# Patient Record
Sex: Male | Born: 1990 | Race: Black or African American | Hispanic: No | Smoking: Current some day smoker
Health system: Southern US, Community
[De-identification: ages and names within clinical notes are randomized; demographics above are authoritative.]

---

## 2019-06-11 ENCOUNTER — Emergency Department (HOSPITAL_COMMUNITY): Payer: Self-pay

## 2019-06-11 ENCOUNTER — Emergency Department (HOSPITAL_COMMUNITY)
Admission: EM | Admit: 2019-06-11 | Discharge: 2019-06-11 | Disposition: A | Discharge status: Home / Self Care | Payer: Self-pay | Attending: Emergency Medicine | Admitting: Emergency Medicine

## 2019-06-11 ENCOUNTER — Other Ambulatory Visit: Payer: Self-pay

## 2019-06-11 ENCOUNTER — Encounter (HOSPITAL_COMMUNITY): Payer: Self-pay

## 2019-06-11 DIAGNOSIS — F17200 Nicotine dependence, unspecified, uncomplicated: Secondary | ICD-10-CM | POA: Insufficient documentation

## 2019-06-11 DIAGNOSIS — R112 Nausea with vomiting, unspecified: Secondary | ICD-10-CM | POA: Insufficient documentation

## 2019-06-11 DIAGNOSIS — R1013 Epigastric pain: Secondary | ICD-10-CM | POA: Insufficient documentation

## 2019-06-11 LAB — COMPREHENSIVE METABOLIC PANEL
ALT: 10 U/L (ref 0–44)
AST: 16 U/L (ref 15–41)
Albumin: 4.4 g/dL (ref 3.5–5.0)
Alkaline Phosphatase: 54 U/L (ref 38–126)
Anion gap: 10 (ref 5–15)
BUN: 7 mg/dL (ref 6–20)
CO2: 25 mmol/L (ref 22–32)
Calcium: 9.4 mg/dL (ref 8.9–10.3)
Chloride: 105 mmol/L (ref 98–111)
Creatinine, Ser: 1.19 mg/dL (ref 0.61–1.24)
GFR calc Af Amer: >60 mL/min (ref >60)
GFR calc non Af Amer: >60 mL/min (ref >60)
Glucose, Bld: 99 mg/dL (ref 70–99)
Potassium: 3.7 mmol/L (ref 3.5–5.1)
Sodium: 140 mmol/L (ref 135–145)
Total Bilirubin: 0.5 mg/dL (ref 0.3–1.2)
Total Protein: 7.5 g/dL (ref 6.5–8.1)

## 2019-06-11 LAB — CBC WITH DIFFERENTIAL/PLATELET
Abs Immature Granulocytes: 0.02 10*3/uL (ref 0.00–0.07)
Basophils Absolute: 0.1 10*3/uL (ref 0.0–0.1)
Basophils Relative: 1 %
Eosinophils Absolute: 0.1 10*3/uL (ref 0.0–0.5)
Eosinophils Relative: 2 %
HCT: 45.8 % (ref 39.0–52.0)
Hemoglobin: 15 g/dL (ref 13.0–17.0)
Immature Granulocytes: 0 %
Lymphocytes Relative: 23 %
Lymphs Abs: 2.1 10*3/uL (ref 0.7–4.0)
MCH: 30.5 pg (ref 26.0–34.0)
MCHC: 32.8 g/dL (ref 30.0–36.0)
MCV: 93.3 fL (ref 80.0–100.0)
Monocytes Absolute: 0.9 10*3/uL (ref 0.1–1.0)
Monocytes Relative: 10 %
Neutro Abs: 5.9 10*3/uL (ref 1.7–7.7)
Neutrophils Relative %: 64 %
Platelets: 191 10*3/uL (ref 150–400)
RBC: 4.91 MIL/uL (ref 4.22–5.81)
RDW: 13.4 % (ref 11.5–15.5)
WBC: 9.2 10*3/uL (ref 4.0–10.5)
nRBC: 0 % (ref 0.0–0.2)

## 2019-06-11 LAB — RAPID URINE DRUG SCREEN, HOSP PERFORMED
Amphetamines: POSITIVE — AB
Barbiturates: NOT DETECTED
Benzodiazepines: NOT DETECTED
Cocaine: NOT DETECTED
Opiates: POSITIVE — AB
Tetrahydrocannabinol: POSITIVE — AB

## 2019-06-11 LAB — URINALYSIS, ROUTINE W REFLEX MICROSCOPIC
Bilirubin Urine: NEGATIVE
Glucose, UA: NEGATIVE mg/dL
Hgb urine dipstick: NEGATIVE
Ketones, ur: NEGATIVE mg/dL
Leukocytes,Ua: NEGATIVE
Nitrite: NEGATIVE
Protein, ur: NEGATIVE mg/dL
Specific Gravity, Urine: 1.003 — ABNORMAL LOW (ref 1.005–1.030)
pH: 7 (ref 5.0–8.0)

## 2019-06-11 LAB — LIPASE, BLOOD: Lipase: 20 U/L (ref 11–51)

## 2019-06-11 MED ORDER — SODIUM CHLORIDE 0.9 % IV BOLUS
1000.0000 mL | Freq: Once | INTRAVENOUS | Status: AC
  Administered 2019-06-11: 20:00:00 1000 mL via INTRAVENOUS

## 2019-06-11 MED ORDER — ONDANSETRON 4 MG PO TBDP: 4.0000 mg | ORAL_TABLET | Freq: Three times a day (TID) | ORAL | 0 refills | Status: DC | PRN

## 2019-06-11 MED ORDER — ONDANSETRON HCL 4 MG/2ML IJ SOLN
4.0000 mg | Freq: Once | INTRAMUSCULAR | Status: AC
  Administered 2019-06-11: 20:00:00 4 mg via INTRAVENOUS
  Filled 2019-06-11: qty 2

## 2019-06-11 MED ORDER — MORPHINE SULFATE (PF) 4 MG/ML IV SOLN
4.0000 mg | Freq: Once | INTRAVENOUS | Status: AC
  Administered 2019-06-11: 20:00:00 4 mg via INTRAVENOUS
  Filled 2019-06-11: qty 1

## 2019-06-11 NOTE — ED Notes (Signed)
Pt was discharged from the ED. Pt read and understood discharge paperwork. Pt had vital signs completed. Pt conscious, breathing, and A&Ox4. No distress noted. Pt speaking in complete sentences. Pt ambulated out of the ED with a smooth and steady gait. E-signature not available.  

## 2019-06-11 NOTE — ED Provider Notes (Signed)
[facility] EMERGENCY DEPARTMENT Provider Note   CSN: [id number] Arrival date & time: 06/11/19  1845    History Chief Complaint  Patient presents with  . Abdominal Pain   Adam Atkinson is a 29 y.o. male with no significant past medical history who presents for evaluation of emesis.  Patient states awoke this morning with episode of NBNB emesis.  Patient states to his last night [place] food is what he vomited up this morning.  Has had multiple episodes of retching.  Mid to some generalized abdominal pain.  No suspicious food intake, recent travel or sick contacts.  No prior abdominal surgeries.  He is a daily marijuana user however denies any prior history of cannabinoid hyperemesis.  Denies fever, chills, chest pain, shortness of breath, dysuria, diarrhea or constipation.  Denies additional aggravating or alleviating factors.  Rates his abdominal pain a 4/10.  History obtained from patient and past medical record.  No interpreter was used.  HPI     History reviewed. No pertinent past medical history.  There are no problems to display for this patient.   History reviewed. No pertinent surgical history.     History reviewed. No pertinent family history.  Social History   Tobacco Use  . Smoking status: Current Some Day Smoker  Substance Use Topics  . Alcohol use: Yes    Comment: occ   . Drug use: Yes    Types: Marijuana    Home Medications Prior to Admission medications   Medication Sig Start Date End Date Taking? Authorizing Provider  acetaminophen (TYLENOL) 500 MG tablet Take 1,000 mg by mouth every 6 (six) hours as needed for moderate pain.   Yes [provider]  ondansetron (ZOFRAN ODT) 4 MG disintegrating tablet Take 1 tablet (4 mg total) by mouth every 8 (eight) hours as needed for nausea or vomiting. 06/11/19   Kaeo Jacome A, PA-C    Allergies    Aspirin  Review of Systems   Review of Systems  Constitutional: Negative.   HENT:  Negative.   Respiratory: Negative.   Cardiovascular: Negative.   Gastrointestinal: Positive for abdominal pain, nausea and vomiting.  Genitourinary: Negative.   Musculoskeletal: Negative.   Skin: Negative.   Neurological: Negative.   All other systems reviewed and are negative.   Physical Exam Updated Vital Signs BP 122/82 (BP Location: Right Arm)   Pulse 81   Temp 99 F (37.2 C) (Oral)   Resp 15   Ht 5\' 11"  (1.803 m)   Wt 74.8 kg   SpO2 99%   BMI 23.01 kg/m   Physical Exam Vitals and nursing note reviewed.  Constitutional:      General: He is not in acute distress.    Appearance: He is well-developed. He is not ill-appearing, toxic-appearing or diaphoretic.     Comments: Smells of marijuana  HENT:     Head: Normocephalic and atraumatic.     Mouth/Throat:     Mouth: Mucous membranes are moist.     Pharynx: Oropharynx is clear.  Eyes:     Pupils: Pupils are equal, round, and reactive to light.  Cardiovascular:     Rate and Rhythm: Regular rhythm. Tachycardia present.     Heart sounds: Normal heart sounds.     Comments: HR 115 in room Pulmonary:     Effort: Pulmonary effort is normal. No respiratory distress.     Breath sounds: Normal breath sounds.  Abdominal:     General: Bowel sounds are normal. There  is no distension.     Palpations: Abdomen is soft.     Tenderness: There is generalized abdominal tenderness. There is no right CVA tenderness, left CVA tenderness, guarding or rebound. Negative signs include Murphy's sign and McBurney's sign.     Hernia: No hernia is present.  Musculoskeletal:        General: Normal range of motion.     Cervical back: Normal range of motion and neck supple.  Skin:    General: Skin is warm and dry.     Capillary Refill: Capillary refill takes less than 2 seconds.  Neurological:     General: No focal deficit present.     Mental Status: He is alert.    ED Results / Procedures / Treatments   Labs (all labs ordered are listed,  but only abnormal results are displayed) Labs Reviewed  CBC WITH DIFFERENTIAL/PLATELET  COMPREHENSIVE METABOLIC PANEL  LIPASE, BLOOD  URINALYSIS, ROUTINE W REFLEX MICROSCOPIC  RAPID URINE DRUG SCREEN, HOSP PERFORMED    EKG None  Radiology DG Abdomen Acute W/Chest  Result Date: 06/11/2019 CLINICAL DATA:  Chills and emesis EXAM: DG ABDOMEN ACUTE W/ 1V CHEST COMPARISON:  None. FINDINGS: Cardiac shadow is within normal limits. The lungs are well aerated bilaterally. No bony abnormality is seen. Scattered large and small bowel gas is noted. No obstructive changes are seen. The stomach is distended with ingested food stuffs. No abnormal mass or abnormal calcifications are seen. No bony abnormality is noted. No free air is seen. IMPRESSION: No acute abnormality in the chest and abdomen. Electronically Signed   By: Alcide Clever M.D.   On: 06/11/2019 19:52    Procedures Procedures (including critical care time)  Medications Ordered in ED Medications  ondansetron (ZOFRAN) injection 4 mg (4 mg Intravenous Given 06/11/19 2000)  sodium chloride 0.9 % bolus 1,000 mL (0 mLs Intravenous Stopped 06/11/19 2142)  morphine 4 MG/ML injection 4 mg (4 mg Intravenous Given 06/11/19 2000)    ED Course  I have reviewed the triage vital signs and the nursing notes.  Pertinent labs & imaging results that were available during my care of the patient were reviewed by me and considered in my medical decision making (see chart for details).  29 year old male patient otherwise well presents for evaluation of emesis.  He is afebrile, nonseptic, not ill-appearing.  Daily marijuana user.  No suspicious food intake.  Some mild tenderness to epigastric region however no rebound or guarding.  Tachycardic heart denies chest pain, shortness of breath.  No evidence of DVT on exam.  Plan on labs, imaging and reassess.  Labs and imaging personally reviewed and interpreted: CBC without leukocytosis Lipase 20 Metabolic panel with  electrolyte, renal abnormality DG abdomen chest without any acute abnormality EKG without STEMI  2100: Patient reassessed.  Symptoms improved with antiemetics here in ED.  Tachycardia resolved with fluids.  Repeat abdomen soft, nontender.  No rebound or guarding.  Will p.o. challenge  2200: Patient reassessed.  Abdomen soft, nontender.  Tolerating p.o. intake without difficulty.  States he is ready to go home.  Question gastritis versus cannabinoid hyperemesis given he admits to daily marijuana use  Patient is nontoxic, nonseptic appearing, in no apparent distress.  Patient's pain and other symptoms adequately managed in emergency department.  Fluid bolus given.  Labs, imaging and vitals reviewed.  Patient does not meet the SIRS or Sepsis criteria.  On repeat exam patient does not have a surgical abdomin and there are no peritoneal signs.  No indication of appendicitis, bowel obstruction, bowel perforation, cholecystitis, diverticulitis.  Patient discharged home with symptomatic treatment and given strict instructions for follow-up with their primary care physician.  I have also discussed reasons to return immediately to the ER.  Patient expresses understanding and agrees with plan.  The patient has been appropriately medically screened and/or stabilized in the ED. I have low suspicion for any other emergent medical condition which would require further screening, evaluation or treatment in the ED or require inpatient management.  Patient is hemodynamically stable and in no acute distress.  Patient able to ambulate in department prior to ED.  Evaluation does not show acute pathology that would require ongoing or additional emergent interventions while in the emergency department or further inpatient treatment.  I have discussed the diagnosis with the patient and answered all questions.  Pain is been managed while in the emergency department and patient has no further complaints prior to discharge.   Patient is comfortable with plan discussed in room and is stable for discharge at this time.  I have discussed strict return precautions for returning to the emergency department.  Patient was encouraged to follow-up with PCP/specialist refer to at discharge.    MDM Rules/Calculators/A&P                       Final Clinical Impression(s) / ED Diagnoses Final diagnoses:  Epigastric pain  Non-intractable vomiting with nausea, unspecified vomiting type    Rx / DC Orders ED Discharge Orders         Ordered    ondansetron (ZOFRAN ODT) 4 MG disintegrating tablet  Every 8 hours PRN     06/11/19 2210           Draya Felker A, PA-C 06/11/19 2213    Tilden Fossa, MD 06/12/19 1116
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
## 2019-06-11 NOTE — Discharge Instructions (Signed)
Return for new or worsening symptoms

## 2019-08-14 ENCOUNTER — Other Ambulatory Visit: Payer: Self-pay

## 2019-08-14 ENCOUNTER — Encounter (HOSPITAL_COMMUNITY): Payer: Self-pay | Admitting: Emergency Medicine

## 2019-08-14 ENCOUNTER — Emergency Department (HOSPITAL_COMMUNITY): Payer: Medicaid - Out of State

## 2019-08-14 ENCOUNTER — Emergency Department (HOSPITAL_COMMUNITY)
Admission: EM | Admit: 2019-08-14 | Discharge: 2019-08-14 | Disposition: A | Discharge status: Home / Self Care | Payer: Medicaid - Out of State | Attending: Emergency Medicine | Admitting: Emergency Medicine

## 2019-08-14 DIAGNOSIS — F17210 Nicotine dependence, cigarettes, uncomplicated: Secondary | ICD-10-CM | POA: Insufficient documentation

## 2019-08-14 DIAGNOSIS — Y939 Activity, unspecified: Secondary | ICD-10-CM | POA: Insufficient documentation

## 2019-08-14 DIAGNOSIS — Y92810 Car as the place of occurrence of the external cause: Secondary | ICD-10-CM | POA: Insufficient documentation

## 2019-08-14 DIAGNOSIS — S4391XA Sprain of unspecified parts of right shoulder girdle, initial encounter: Secondary | ICD-10-CM | POA: Diagnosis not present

## 2019-08-14 DIAGNOSIS — Z23 Encounter for immunization: Secondary | ICD-10-CM | POA: Insufficient documentation

## 2019-08-14 DIAGNOSIS — X58XXXA Exposure to other specified factors, initial encounter: Secondary | ICD-10-CM | POA: Insufficient documentation

## 2019-08-14 DIAGNOSIS — S161XXA Strain of muscle, fascia and tendon at neck level, initial encounter: Secondary | ICD-10-CM | POA: Insufficient documentation

## 2019-08-14 DIAGNOSIS — S4991XA Unspecified injury of right shoulder and upper arm, initial encounter: Secondary | ICD-10-CM | POA: Diagnosis present

## 2019-08-14 DIAGNOSIS — T07XXXA Unspecified multiple injuries, initial encounter: Secondary | ICD-10-CM

## 2019-08-14 DIAGNOSIS — R202 Paresthesia of skin: Secondary | ICD-10-CM | POA: Insufficient documentation

## 2019-08-14 DIAGNOSIS — Y999 Unspecified external cause status: Secondary | ICD-10-CM | POA: Insufficient documentation

## 2019-08-14 DIAGNOSIS — S43401A Unspecified sprain of right shoulder joint, initial encounter: Secondary | ICD-10-CM

## 2019-08-14 MED ORDER — TETANUS-DIPHTH-ACELL PERTUSSIS 5-2.5-18.5 LF-MCG/0.5 IM SUSP
0.5000 mL | Freq: Once | INTRAMUSCULAR | Status: AC
  Administered 2019-08-14: 0.5 mL via INTRAMUSCULAR
  Filled 2019-08-14: qty 0.5

## 2019-08-14 MED ORDER — HYDROCODONE-ACETAMINOPHEN 5-325 MG PO TABS
2.0000 | ORAL_TABLET | Freq: Once | ORAL | Status: AC
  Administered 2019-08-14: 2 via ORAL
  Filled 2019-08-14: qty 2

## 2019-08-14 MED ORDER — HYDROCODONE-ACETAMINOPHEN 5-325 MG PO TABS: 1.0000 | ORAL_TABLET | Freq: Four times a day (QID) | ORAL | 0 refills | Status: DC | PRN
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
## 2020-02-15 ENCOUNTER — Emergency Department (HOSPITAL_COMMUNITY)
Admission: EM | Admit: 2020-02-15 | Discharge: 2020-02-15 | Disposition: A | Discharge status: Home / Self Care | Payer: 59 | Attending: Emergency Medicine | Admitting: Emergency Medicine

## 2020-02-15 ENCOUNTER — Other Ambulatory Visit: Payer: Self-pay

## 2020-02-15 ENCOUNTER — Encounter (HOSPITAL_COMMUNITY): Payer: Self-pay | Admitting: Emergency Medicine

## 2020-02-15 DIAGNOSIS — F17210 Nicotine dependence, cigarettes, uncomplicated: Secondary | ICD-10-CM | POA: Diagnosis not present

## 2020-02-15 DIAGNOSIS — K0889 Other specified disorders of teeth and supporting structures: Secondary | ICD-10-CM | POA: Insufficient documentation

## 2020-02-15 MED ORDER — PENICILLIN V POTASSIUM 500 MG PO TABS
500.0000 mg | ORAL_TABLET | Freq: Once | ORAL | Status: AC
  Administered 2020-02-15: 500 mg via ORAL
  Filled 2020-02-15: qty 1

## 2020-02-15 MED ORDER — HYDROCODONE-ACETAMINOPHEN 5-325 MG PO TABS
1.0000 | ORAL_TABLET | Freq: Once | ORAL | Status: AC
  Administered 2020-02-15: 1 via ORAL
  Filled 2020-02-15: qty 1

## 2020-02-15 MED ORDER — NAPROXEN 500 MG PO TABS: 500.0000 mg | ORAL_TABLET | Freq: Two times a day (BID) | ORAL | 0 refills | Status: AC

## 2020-02-15 MED ORDER — PENICILLIN V POTASSIUM 500 MG PO TABS: 500.0000 mg | ORAL_TABLET | Freq: Four times a day (QID) | ORAL | 0 refills | Status: AC

## 2020-02-15 NOTE — ED Provider Notes (Signed)
[facility]-EMERGENCY DEPT Provider Note   CSN: [id number] Arrival date & time: 02/15/20  1717     History Chief Complaint  Patient presents with  . Dental Pain    Adam Atkinson is a 29 y.o. male with noncontributory past medical history.  HPI Patient presents to emergency room today with chief complaint of dental pain x2 days.  Patient states his he chipped his right upper tooth a long time ago.  He states this time he was eating a now and later and felt his tooth to begin.  He is describing constant throbbing pain.  Pain is localized to the tooth.  He has tried taking ibuprofen with transient symptom relief.  He rates his pain 8 of 10 in severity.  He does not have a dentist currently, has been calling around trying to find one to schedule an appointment without any success. He is able to eat soft foods without difficulty. Denies fever, chills, voice change, inability to control secretions, nausea/vomiting, facial swelling, dysphagia, odynophagia, drainage or trauma.   History reviewed. No pertinent past medical history.  There are no problems to display for this patient.   History reviewed. No pertinent surgical history.     History reviewed. No pertinent family history.  Social History   Tobacco Use  . Smoking status: Current Some Day Smoker    Types: Cigarettes  . Smokeless tobacco: Never Used  Substance Use Topics  . Alcohol use: Yes    Comment: occ   . Drug use: Not Currently    Types: Marijuana    Home Medications Prior to Admission medications   Medication Sig Start Date End Date Taking? Authorizing Provider  naproxen (NAPROSYN) 500 MG tablet Take 1 tablet (500 mg total) by mouth 2 (two) times daily with a meal for 7 days. 02/15/20 02/22/20  Namon Cirri E, PA-C  penicillin v potassium (VEETID) 500 MG tablet Take 1 tablet (500 mg total) by mouth 4 (four) times daily for 7 days. 02/15/20 02/22/20  Shanon Ace, PA-C     Allergies    Aspirin  Review of Systems   Review of Systems All other systems are reviewed and are negative for acute change except as noted in the HPI.  Physical Exam Updated Vital Signs BP 117/80 (BP Location: Right Arm)   Pulse (!) 56   Temp 98 F (36.7 C) (Oral)   Resp 16   Ht 6' (1.829 m)   Wt 74.8 kg   SpO2 97%   BMI 22.38 kg/m   Physical Exam Vitals and nursing note reviewed.  Constitutional:      General: He is not in acute distress.    Appearance: He is not toxic-appearing.  HENT:     Head: Normocephalic and atraumatic.     Nose: Nose normal.     Mouth/Throat:      Comments: No noted area of swelling or fluctuance. No trismus. Mouth opening to at least 3 finger widths. Handles oral secretions without difficulty. External exam shows no asymmetry of the jaw line or face, no signs of obvious swelling or infection. No swelling or tenderness to the submental or submandibular regions. No swelling or tenderness into the soft tissues of the neck.Full active range of motion of the jaw. Neck is supple with full active range of motion, no tenderness to palpation of the soft tissues.  Eyes:     General: No scleral icterus.       Right eye: No discharge.  Left eye: No discharge.     Conjunctiva/sclera: Conjunctivae normal.  Cardiovascular:     Rate and Rhythm: Normal rate and regular rhythm.     Pulses: Normal pulses.     Heart sounds: Normal heart sounds.  Pulmonary:     Effort: Pulmonary effort is normal.     Breath sounds: Normal breath sounds.  Abdominal:     General: There is no distension.  Musculoskeletal:        General: Normal range of motion.     Cervical back: Normal range of motion. No muscular tenderness.  Skin:    General: Skin is warm and dry.  Neurological:     Mental Status: He is oriented to person, place, and time.  Psychiatric:        Behavior: Behavior normal.     ED Results / Procedures / Treatments   Labs (all labs  ordered are listed, but only abnormal results are displayed) Labs Reviewed - No data to display  EKG None  Radiology No results found.  Procedures Procedures (including critical care time)  Medications Ordered in ED Medications  HYDROcodone-acetaminophen (NORCO/VICODIN) 5-325 MG per tablet 1 tablet (has no administration in time range)  penicillin v potassium (VEETID) tablet 500 mg (has no administration in time range)    ED Course  I have reviewed the triage vital signs and the nursing notes.  Pertinent labs & imaging results that were available during my care of the patient were reviewed by me and considered in my medical decision making (see chart for details).    MDM Rules/Calculators/A&P                          History provided by patient with additional history obtained from chart review.    Pt is well appearing and is presenting with dental pain. Differential Diagnosis includes but is not limited to: toothache, abscess, periapical abscess, dental caries, cracked tooth, maxillary sinusitis, gingivitis, gum hyperplasia, tooth fracture, tooth dislocation. On exam patient has chronic dental decay. Patient with toothache.  No gross abscess.  Exam unconcerning for Ludwig's angina or spread of infection.  Will treat with penicillin and anti-inflammatories medicine.  Urged patient to follow-up with dentist and given dental resource list. VSS.  Pt appears stable for d/c. Strict return precautions discussed.   Portions of this note were generated with Scientist, clinical (histocompatibility and immunogenetics). Dictation errors may occur despite best attempts at proofreading.   Final Clinical Impression(s) / ED Diagnoses Final diagnoses:  Pain, dental    Rx / DC Orders ED Discharge Orders         Ordered    naproxen (NAPROSYN) 500 MG tablet  2 times daily with meals        02/15/20 2107    penicillin v potassium (VEETID) 500 MG tablet  4 times daily        02/15/20 2107           Kandice Hams 02/15/20 2114    Benjiman Core, MD 02/15/20 2357

## 2020-02-15 NOTE — Discharge Instructions (Addendum)
Today you were seen for dental pain.  If you start to experience and new or worsening symptoms return to the emergency department. If you start to experience fever, chills, neck stiffness/pain, or inability to move your neck or open your mouth come back to the emergency department immediately. If you begin to experience any blistering, rashes, swelling, or difficulty breathing seek medical care for evaluation of potentially more serious side effects.  Medications:  -I have prescribed you an antibiotic penicillin to treat the infection and Naproxen which is an anti-inflammatory medicine to treat the pain.  You can apply a cool compress for 15 to 20 minutes, but avoid applying heat to the area. -Continue usual home medications.  2. Follow Up: Follow up with the on call dentist Dr. Mayford Knife. Call the number listed in your discharge paperwork.   Use the resource guide listed below to help you find a dentist if needed.   It is very important that you get evaluated by a dentist as soon as possible. Call tomorrow to schedule an appointment. Read the instructions below.     Be sure to eat something when taking the Naproxen as it can cause stomach upset and at worst stomach bleeding. Do not take additional non steroidal anti-inflammatory medicines such as Ibuprofen, Aleve, Advil, Mobic, Diclofenac, or goodie powder while taking Naproxen. You may supplement with Tylenol.    Dental Care: Organization         Address  Phone  Notes  [facility] Department of [facility] [address] [place], [place] [phone] Accepts children up to age 33 who are enrolled in [place] or [place] Health Choice; pregnant women with a Medicaid card; and children who have applied for Medicaid or [place] Health Choice, but were declined, whose parents can pay a reduced fee at time of service.  [facility] Department of [facility]  [address], [place] [phone] Accepts  children up to age 62 who are enrolled in [place] or [place] Health Choice; pregnant women with a Medicaid card; and children who have applied for Medicaid or [place] Health Choice, but were declined, whose parents can pay a reduced fee at time of service.  [facility]  [address] [place], [place] [phone] Patients are seen by appointment only. Walk-ins are not accepted. [facility] will see patients 33 years of age and older. Monday - Tuesday (8am-5pm) Most Wednesdays (8:30-5pm) $30 per visit, cash only  [facility]  [address], [facility] [phone] Patients are seen by appointment only. Walk-ins are not accepted. [facility] will see patients 59 years of age and older. One Wednesday Evening (Monthly: Volunteer Based).  $30 per visit, cash only  [employer]  [phone] for adults; Children under age 74, call Graduate Pediatric Dentistry at [phone]. Children aged 97-14, please call [phone] to request a pediatric application.  Dental services are provided in all areas of dental care including fillings, crowns and bridges, complete and partial dentures, implants, gum treatment, root canals, and extractions. Preventive care is also provided. Treatment is provided to both adults and children. Patients are selected via a lottery and there is often a waiting list.   [facility] [address], [place]  [phone] [email or web address]   [facility] [address] [place], [place] [phone], Ext. [phone] Second and Fourth Thursday of each month, opens  at 6:30 AM; Clinic ends at 9 AM.  Patients are seen on a first-come first-served basis, and a limited number are seen during each clinic.   [facility]  [address] [place], [place] [phone]   Eligibility Requirements You must have lived in [place], [place], or [place] counties for at least the  last three months.   You cannot be eligible for state or federal sponsored [place], including [employer], [place], or [employer].   You generally cannot be eligible for healthcare insurance through your employer.    How to apply: Eligibility screenings are held every Tuesday and Wednesday afternoon from 1:00 pm until 4:00 pm. You do not need an appointment for the interview!  [facility] [address], [place], [place] [phone]   [facility]  [phone]   [facility] Department  [phone]   [facility] Health Department  [phone]    RESOURCE GUIDE:  Dental Problems  Patients with Medicaid: [facility] [phone]                                [phone] [address] [employer] Phone:  [phone]                                                  Phone:  [phone]  If unable to pay or uninsured, contact:  [facility] or [facility]. to become qualified for the adult dental clinic.  Insufficient Money for Medicine Contact [employer]:  call "211" or [facility] 4144503605.  No Primary Care Doctor Call [facility]  [phone] Other agencies that provide inexpensive medical care    [facility]  [phone]    [facility]  [phone]    [facility]  [phone]    [facility]  [phone]    [facility]  [phone]    [facility]  [phone]

## 2020-02-15 NOTE — ED Triage Notes (Signed)
Pt states that he started having R upper dental pain on a cracked tooth yesterday. Taking ibuprofen at home w/o relief. Alert and oriented.
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
## 2022-02-12 ENCOUNTER — Emergency Department (HOSPITAL_COMMUNITY)
Admission: EM | Admit: 2022-02-12 | Discharge: 2022-02-12 | Disposition: A | Discharge status: Home / Self Care | Payer: Medicaid - Out of State | Attending: Emergency Medicine | Admitting: Emergency Medicine

## 2022-02-12 ENCOUNTER — Other Ambulatory Visit: Payer: Self-pay

## 2022-02-12 DIAGNOSIS — K047 Periapical abscess without sinus: Secondary | ICD-10-CM | POA: Diagnosis not present

## 2022-02-12 DIAGNOSIS — K0889 Other specified disorders of teeth and supporting structures: Secondary | ICD-10-CM | POA: Diagnosis present

## 2022-02-12 MED ORDER — PENICILLIN V POTASSIUM 500 MG PO TABS: 500.0000 mg | ORAL_TABLET | Freq: Four times a day (QID) | ORAL | 0 refills | Status: AC

## 2022-02-12 MED ORDER — OXYCODONE HCL 5 MG PO TABS
5.0000 mg | ORAL_TABLET | Freq: Once | ORAL | Status: AC
  Administered 2022-02-12: 5 mg via ORAL
  Filled 2022-02-12: qty 1

## 2022-02-12 NOTE — ED Provider Notes (Signed)
[facility]EM[facility] Provider Note   CSN: [id number] Arrival date & time: 02/12/22  6629     History  Chief Complaint  Patient presents with   Dental Pain    Adam Atkinson is a 31 y.o. male.  31 year old male with no past medical history presents to the ED with a chief complaint of right-sided dental infection ongoing for several days.  Patient does have an appointment scheduled with his dentist in 2 days, at [facility].  He endorses worsening pain to the right posterior aspect of his lower jaw shooting from his ear along the back of his neck.  Reports the pain has been ongoing, has been doing Tylenol every 3 hours to help with pain control without much improvement in symptoms.  Symptoms are exacerbated with mastication along with eating.  Not running any fevers, tolerating his secretions, no other complaints.  The history is provided by the patient.  Dental Pain Location:  Lower Lower teeth location:  32/RL 3rd molar Quality:  Aching Severity:  Moderate Onset quality:  Gradual Duration:  1 week Timing:  Constant Progression:  Worsening Chronicity:  Recurrent Context: abscess and dental fracture   Associated symptoms: no fever        Home Medications Prior to Admission medications   Medication Sig Start Date End Date Taking? Authorizing Provider  penicillin v potassium (VEETID) 500 MG tablet Take 1 tablet (500 mg total) by mouth 4 (four) times daily for 7 days. 02/12/22 02/19/22 Yes Jessamine Barcia, Leonie Douglas, PA-C      Allergies    Aspirin    Review of Systems   Review of Systems  Constitutional:  Negative for fever.  HENT:  Positive for dental problem.     Physical Exam Updated Vital Signs BP 112/72 (BP Location: Left Arm)   Pulse 87   Temp 98.4 F (36.9 C) (Oral)   Resp 18   SpO2 94%  Physical Exam Vitals and nursing note reviewed.  Constitutional:      Appearance: Normal appearance.  HENT:     Head: Normocephalic and atraumatic.      Mouth/Throat:     Mouth: Mucous membranes are moist.     Pharynx: Oropharynx is clear. Uvula midline. No oropharyngeal exudate or posterior oropharyngeal erythema.     Tonsils: No tonsillar exudate or tonsillar abscesses.   Eyes:     Pupils: Pupils are equal, round, and reactive to light.  Cardiovascular:     Rate and Rhythm: Normal rate.  Pulmonary:     Effort: Pulmonary effort is normal.  Abdominal:     General: Abdomen is flat.  Musculoskeletal:     Cervical back: Normal range of motion and neck supple.  Skin:    General: Skin is warm and dry.  Neurological:     Mental Status: He is alert and oriented to person, place, and time.     ED Results / Procedures / Treatments   Labs (all labs ordered are listed, but only abnormal results are displayed) Labs Reviewed - No data to display  EKG None  Radiology No results found.  Procedures Procedures    Medications Ordered in ED Medications  oxyCODONE (Oxy IR/ROXICODONE) immediate release tablet 5 mg (5 mg Oral Given 02/12/22 1056)    ED Course/ Medical Decision Making/ A&P                           Medical Decision Making Risk Prescription drug management.  Patient presents to the ED with a chief complaint of dental pain that is been ongoing for the last couple days.  Does have an appointment scheduled with the dentist in 2 days, exacerbated pain with any opening and closing of his mouth.  He does have an appointment with dentist on Friday, reports the pain is exacerbated with any type of food intake.  He has been taking Tylenol to help with pain.  Given Roxie while in the emergency department.  I discussed placing him on antibiotics in order to prophylactically treat any further infection until he sees a dentist on Friday.  He is agreeable to plan and treatment, patient hemodynamically stable for discharge.   Portions of this note were generated with Scientist, clinical (histocompatibility and immunogenetics). Dictation errors may occur despite  best attempts at proofreading.   Final Clinical Impression(s) / ED Diagnoses Final diagnoses:  Pain, dental    Rx / DC Orders ED Discharge Orders          Ordered    penicillin v potassium (VEETID) 500 MG tablet  4 times daily        02/12/22 1055              Claude Manges, PA-C 02/12/22 1101    Jacalyn Lefevre, MD 02/12/22 1126

## 2022-02-12 NOTE — Discharge Instructions (Addendum)
I have prescribed antibiotics in order to help with treat your dental infection.Please take 1 tablet four times a day for the next 7 days to help with your symptoms.  Please alternate Tylenol and ibuprofen as you are taking this for pain.  Do not exceed Tylenol intake of 4,000 in 24 hours.    Follow up with your dentist as scheduled appointment.

## 2022-02-12 NOTE — ED Triage Notes (Signed)
Pt reports right lower dental pain. Pt has extraction scheduled for Friday. Pt reporting neck pain and jaw pain.

## 2022-02-18 ENCOUNTER — Emergency Department (HOSPITAL_COMMUNITY)
Admission: EM | Admit: 2022-02-18 | Discharge: 2022-02-18 | Disposition: A | Discharge status: Home / Self Care | Payer: Medicaid - Out of State | Attending: Emergency Medicine | Admitting: Emergency Medicine

## 2022-02-18 ENCOUNTER — Encounter (HOSPITAL_COMMUNITY): Payer: Self-pay | Admitting: Emergency Medicine

## 2022-02-18 ENCOUNTER — Other Ambulatory Visit: Payer: Self-pay

## 2022-02-18 DIAGNOSIS — K0889 Other specified disorders of teeth and supporting structures: Secondary | ICD-10-CM | POA: Diagnosis present

## 2022-02-18 DIAGNOSIS — K029 Dental caries, unspecified: Secondary | ICD-10-CM | POA: Insufficient documentation

## 2022-02-18 DIAGNOSIS — K0381 Cracked tooth: Secondary | ICD-10-CM | POA: Insufficient documentation

## 2022-02-18 MED ORDER — HYDROCODONE-ACETAMINOPHEN 5-325 MG PO TABS
1.0000 | ORAL_TABLET | Freq: Once | ORAL | Status: AC
  Administered 2022-02-18: 1 via ORAL
  Filled 2022-02-18: qty 1

## 2022-02-18 MED ORDER — OXYCODONE-ACETAMINOPHEN 5-325 MG PO TABS: 1.0000 | ORAL_TABLET | Freq: Four times a day (QID) | ORAL | 0 refills | Status: AC | PRN

## 2022-02-18 MED ORDER — OXYCODONE HCL 5 MG PO TABS
2.5000 mg | ORAL_TABLET | Freq: Once | ORAL | Status: AC
  Administered 2022-02-18: 2.5 mg via ORAL
  Filled 2022-02-18: qty 1

## 2022-02-18 NOTE — Discharge Instructions (Signed)
Please follow-up with your dentist as prescribed or oral maxillary surgeon.  If you develop fever, chills, worsening swelling please return to the ER.

## 2022-02-18 NOTE — ED Triage Notes (Signed)
Pt reports right sided lower dental pain.

## 2022-02-18 NOTE — ED Provider Notes (Signed)
[facility] Provider Note   CSN: [id number] Arrival date & time: 02/18/22  1300     History  Chief Complaint  Patient presents with   Dental Pain    Adam Atkinson is a 31 y.o. male, who presents to the ED secondary to dental pain has been going on for the last few weeks.  He states that he went to a dentist for traction, and had difficulty opening his mouth, and was told he had an infection so he has been taking amoxicillin.  He notes that the pain is so bad he cannot open his mouth.  Has only been given ibuprofen or Tylenol without relief.  Denies any fever, chills facial swelling.  No changes with voice or difficulty swallowing.  No sore throat.    Home Medications Prior to Admission medications   Medication Sig Start Date End Date Taking? Authorizing Provider  oxyCODONE-acetaminophen (PERCOCET/ROXICET) 5-325 MG tablet Take 1 tablet by mouth every 6 (six) hours as needed for severe pain. 02/18/22  Yes Jahfari Ambers L, PA  penicillin v potassium (VEETID) 500 MG tablet Take 1 tablet (500 mg total) by mouth 4 (four) times daily for 7 days. 02/12/22 02/19/22  Claude Manges, PA-C      Allergies    Aspirin    Review of Systems   Review of Systems  Constitutional:  Negative for chills and fever.  HENT:  Negative for facial swelling.     Physical Exam Updated Vital Signs BP 134/77 (BP Location: Left Arm)   Pulse 77   Temp (!) 97.4 F (36.3 C) (Oral)   Resp 17   SpO2 99%  Physical Exam Vitals and nursing note reviewed.  Constitutional:      General: He is not in acute distress.    Appearance: He is well-developed.  HENT:     Head: Normocephalic and atraumatic.     Right Ear: Tympanic membrane normal.     Left Ear: Tympanic membrane normal.     Nose: Nose normal.     Mouth/Throat:     Mouth: Mucous membranes are moist.  Eyes:     General:        Right eye: No discharge.        Left eye: No discharge.     Conjunctiva/sclera:  Conjunctivae normal.     Comments: Multiple dental caries, fractured tooth of the third molar on the right lower jaw.  No erythema, fluctuance.  No facial swelling.  Oropharynx clear, no exudate, or uvular deviation.  Pulmonary:     Effort: No respiratory distress.  Neurological:     Mental Status: He is alert.     Comments: Clear speech.   Psychiatric:        Behavior: Behavior normal.        Thought Content: Thought content normal.     ED Results / Procedures / Treatments   Labs (all labs ordered are listed, but only abnormal results are displayed) Labs Reviewed - No data to display  EKG None  Radiology No results found.  Procedures Procedures    Medications Ordered in ED Medications  HYDROcodone-acetaminophen (NORCO/VICODIN) 5-325 MG per tablet 1 tablet (1 tablet Oral Given 02/18/22 1334)  oxyCODONE (Oxy IR/ROXICODONE) immediate release tablet 2.5 mg (2.5 mg Oral Given 02/18/22 1501)    ED Course/ Medical Decision Making/ A&P  Medical Decision Making Patient is a 31 year old male, here for inability to open mouth, secondary to pain.  He is well-appearing on exam, has no findings other than difficulty opening mouth, after pain medication he was able to open his mouth states he feels much better.  He does not have any signs of infection, and is currently on amoxicillin for this.  Discussed with patient, he will follow-up with oral maxillary surgeon, and has an appointment on Monday.  I informed him that the ER will not fill his pain medications again, and that he will need to follow-up with his dentist or an oral maxillary surgeon.  He voiced understanding and was discharged home with strict return precautions.  Risk Prescription drug management.    Final Clinical Impression(s) / ED Diagnoses Final diagnoses:  Pain, dental    Rx / DC Orders ED Discharge Orders          Ordered    oxyCODONE-acetaminophen (PERCOCET/ROXICET) 5-325 MG tablet   Every 6 hours PRN        02/18/22 1619              Bryen Hinderman Elbert Ewings, PA 02/18/22 1624    Elayne Snare K, DO 02/18/22 1706

## 2022-02-18 NOTE — ED Provider Triage Note (Signed)
Emergency Medicine Provider Triage Evaluation Note  Danilo Cappiello , a 31 y.o. male  was evaluated in triage.  Pt complains of not being able to open mouth x2 weeks. States he went for an extraction and was told he had an infection, has been taking amoxicillin w/o relief. Also taking large doses of ibuprofen and tylenol due to the pain. States he can't open his mouth because pain is so bad.  Review of Systems  Positive: Mouth pain Negative: Fever,  chills  Physical Exam  BP 134/77 (BP Location: Left Arm)   Pulse 77   Temp (!) 97.4 F (36.3 C) (Oral)   Resp 17   SpO2 99%  Gen:   Awake, no distress   Resp:  Normal effort  MSK:   Moves extremities without difficulty  Other:   +difficulty opening mouth d/t to pain, fracture tooth in 3rd molar  Medical Decision Making  Medically screening exam initiated at 1:10 PM.  Appropriate orders placed.  Toma Arts was informed that the remainder of the evaluation will be completed by another provider, this initial triage assessment does not replace that evaluation, and the importance of remaining in the ED until their evaluation is complete.     Pete Pelt, [place] 02/18/22 1314

## 2022-05-26 ENCOUNTER — Emergency Department (HOSPITAL_COMMUNITY)
Admission: EM | Admit: 2022-05-26 | Discharge: 2022-05-26 | Disposition: A | Discharge status: Home / Self Care | Payer: Medicaid - Out of State | Attending: Emergency Medicine | Admitting: Emergency Medicine

## 2022-05-26 ENCOUNTER — Emergency Department (HOSPITAL_COMMUNITY): Payer: Medicaid - Out of State

## 2022-05-26 ENCOUNTER — Other Ambulatory Visit: Payer: Self-pay

## 2022-05-26 DIAGNOSIS — W500XXA Accidental hit or strike by another person, initial encounter: Secondary | ICD-10-CM | POA: Insufficient documentation

## 2022-05-26 DIAGNOSIS — Y9367 Activity, basketball: Secondary | ICD-10-CM | POA: Insufficient documentation

## 2022-05-26 DIAGNOSIS — S93401A Sprain of unspecified ligament of right ankle, initial encounter: Secondary | ICD-10-CM | POA: Insufficient documentation

## 2022-05-26 NOTE — ED Triage Notes (Signed)
C/o right ankle pain after rolling ankle yesterday playing basketball. Pt reports hearing a pop.  Ibuprofen, percocet, and gabapentin w/o relief at home.

## 2022-05-26 NOTE — ED Provider Notes (Signed)
[facility] EMERGENCY DEPARTMENT AT [facility] Provider Note   CSN: [id number] Arrival date & time: 05/26/22  1100     History  Chief Complaint  Patient presents with   Ankle Pain    Adam Atkinson is a 32 y.o. male.  32 year old male presents today for evaluation of right ankle injury.  This occurred yesterday around 3 PM.  He states he was playing basketball.  He jumped up for a rebound and landed on another player's foot causing him to roll his ankle.  He states he heard a pop as well.  States he has not been able to bear weight and has been hopping around.  He states he has been icing it without significant improvement.  Denies other injuries.  The history is provided by the patient. No language interpreter was used.       Home Medications Prior to Admission medications   Medication Sig Start Date End Date Taking? Authorizing Provider  oxyCODONE-acetaminophen (PERCOCET/ROXICET) 5-325 MG tablet Take 1 tablet by mouth every 6 (six) hours as needed for severe pain. 02/18/22   Small, Brooke L, PA      Allergies    Aspirin    Review of Systems   Review of Systems  Constitutional:  Negative for fever.  Musculoskeletal:  Positive for arthralgias and joint swelling.  Skin:  Negative for wound.  All other systems reviewed and are negative.   Physical Exam Updated Vital Signs BP 100/86 (BP Location: Right Arm)   Pulse 94   Resp 17   Wt 74 kg   SpO2 100%   BMI 22.13 kg/m  Physical Exam Vitals and nursing note reviewed.  Constitutional:      General: He is not in acute distress.    Appearance: Normal appearance. He is not ill-appearing.  HENT:     Head: Normocephalic and atraumatic.     Nose: Nose normal.  Eyes:     Conjunctiva/sclera: Conjunctivae normal.  Pulmonary:     Effort: Pulmonary effort is normal. No respiratory distress.  Musculoskeletal:        General: No deformity.     Comments: Swelling noted to the right ankle joint.  Tenderness to  palpation present.  Decent range of motion in the right ankle.  2+ DP pulse plus brisk cap refill.  Right tib-fib without tenderness to palpation.  Full range of motion of the right knee.  Skin:    Findings: No rash.  Neurological:     Mental Status: He is alert.     ED Results / Procedures / Treatments   Labs (all labs ordered are listed, but only abnormal results are displayed) Labs Reviewed - No data to display  EKG None  Radiology DG Ankle Complete Right  Result Date: 05/26/2022 CLINICAL DATA:  ankle pain EXAM: RIGHT ANKLE - COMPLETE 3 VIEW COMPARISON:  None Available. FINDINGS: There is no evidence of fracture, dislocation, or subluxation. There is no evidence of arthropathy or other focal bone abnormality. Soft tissue swelling noted at the lateral malleolus. IMPRESSION: Soft tissue swelling. No acute osseous abnormality identified. Electronically Signed   By: Sammie Bench M.D.   On: 05/26/2022 11:32    Procedures Procedures    Medications Ordered in ED Medications - No data to display  ED Course/ Medical Decision Making/ A&P                             Medical Decision Making  Amount and/or Complexity of Data Reviewed Radiology: ordered.   32 year old male presents with right ankle injury.  Consistent with right ankle sprain.  Crutches and Cam boot provided.  Orthopedic referral provided.  Symptomatic management discussed.  Patient appropriate for discharge.  Discharged in stable condition.  X-ray reviewed and I agree with radiology interpretation ordered obtained and reviewed.   Final Clinical Impression(s) / ED Diagnoses Final diagnoses:  Sprain of right ankle, unspecified ligament, initial encounter    Rx / DC Orders ED Discharge Orders     None         Evlyn Courier, PA-C 05/26/22 1251    Sherwood Gambler, MD 05/30/22 [phone]

## 2022-05-26 NOTE — ED Notes (Signed)
Ortho tech called. Will be 45 mins

## 2022-05-26 NOTE — Discharge Instructions (Signed)
You have an ankle sprain.  You were given a cam boot and crutches.  You can bear weight as you can tolerate.  Continue icing the area every 3-4 hours for about 15 minutes at a time.  Continue taking ibuprofen.  Take no more than 800 mg of ibuprofen on a full stomach every 8 hours.  You can also take 1000 mg of Tylenol every 8 hours.  For any concerning symptoms return to the emergency department.  Attached orthopedic information for you above.

## 2022-09-08 ENCOUNTER — Emergency Department (HOSPITAL_COMMUNITY): Payer: Self-pay

## 2022-09-08 ENCOUNTER — Emergency Department (HOSPITAL_COMMUNITY)
Admission: EM | Admit: 2022-09-08 | Discharge: 2022-09-08 | Disposition: A | Discharge status: Home / Self Care | Payer: Self-pay | Attending: Emergency Medicine | Admitting: Emergency Medicine

## 2022-09-08 ENCOUNTER — Emergency Department (HOSPITAL_COMMUNITY)
Admission: EM | Admit: 2022-09-08 | Discharge: 2022-09-08 | Discharge status: Against Medical Advice | Payer: Medicaid - Out of State | Attending: Emergency Medicine | Admitting: Emergency Medicine

## 2022-09-08 ENCOUNTER — Encounter (HOSPITAL_COMMUNITY): Payer: Self-pay | Admitting: Emergency Medicine

## 2022-09-08 DIAGNOSIS — W109XXA Fall (on) (from) unspecified stairs and steps, initial encounter: Secondary | ICD-10-CM | POA: Diagnosis not present

## 2022-09-08 DIAGNOSIS — M549 Dorsalgia, unspecified: Secondary | ICD-10-CM | POA: Insufficient documentation

## 2022-09-08 DIAGNOSIS — W19XXXA Unspecified fall, initial encounter: Secondary | ICD-10-CM

## 2022-09-08 DIAGNOSIS — W108XXA Fall (on) (from) other stairs and steps, initial encounter: Secondary | ICD-10-CM | POA: Insufficient documentation

## 2022-09-08 DIAGNOSIS — M545 Low back pain, unspecified: Secondary | ICD-10-CM | POA: Insufficient documentation

## 2022-09-08 DIAGNOSIS — Z5321 Procedure and treatment not carried out due to patient leaving prior to being seen by health care provider: Secondary | ICD-10-CM | POA: Diagnosis not present

## 2022-09-08 MED ORDER — LIDOCAINE 5 % EX PTCH
1.0000 | MEDICATED_PATCH | CUTANEOUS | Status: DC
  Administered 2022-09-08: 1 via TRANSDERMAL
  Filled 2022-09-08: qty 1

## 2022-09-08 MED ORDER — KETOROLAC TROMETHAMINE 15 MG/ML IJ SOLN
15.0000 mg | Freq: Once | INTRAMUSCULAR | Status: AC
  Administered 2022-09-08: 15 mg via INTRAMUSCULAR
  Filled 2022-09-08: qty 1

## 2022-09-08 MED ORDER — CYCLOBENZAPRINE HCL 10 MG PO TABS: 10.0000 mg | ORAL_TABLET | Freq: Two times a day (BID) | ORAL | 0 refills | Status: AC | PRN

## 2022-09-08 MED ORDER — CYCLOBENZAPRINE HCL 10 MG PO TABS
10.0000 mg | ORAL_TABLET | Freq: Once | ORAL | Status: AC
  Administered 2022-09-08: 10 mg via ORAL
  Filled 2022-09-08: qty 1

## 2022-09-08 NOTE — ED Provider Notes (Signed)
[place] EMERGENCY DEPARTMENT AT [facility] Provider Note   CSN: [id number] Arrival date & time: 09/08/22  1622     History  Chief Complaint  Patient presents with   Marletta Lor    Adam Atkinson is a 32 y.o. male with no documented medical history.  He presents to the ED for evaluation of fall.  The patient reports that on Saturday around 4:30 in the morning he had a fall where he fell down 11 or 12 wooden stairs.  The patient states that he landed on his lumbar spine and has had pain in this area ever since then.  He states that on Sunday he had minimal pain but then this morning woke up with excruciating rated 10 out of 10 back pain.  He attempted to be seen at [facility] but left due to long wait times.  He reports that he took 800 mg of ibuprofen this morning around 10 AM but has had no medications since then.  He denies any bowel or bladder incontinence, groin numbness, lower extremity weakness.  Denies hitting his head or losing consciousness.  States that he slipped because he was wearing socks and this was the reason for his fall.   Fall       Home Medications Prior to Admission medications   Medication Sig Start Date End Date Taking? Authorizing Provider  cyclobenzaprine (FLEXERIL) 10 MG tablet Take 1 tablet (10 mg total) by mouth 2 (two) times daily as needed for muscle spasms. 09/08/22  Yes Al Decant, PA-C  oxyCODONE-acetaminophen (PERCOCET/ROXICET) 5-325 MG tablet Take 1 tablet by mouth every 6 (six) hours as needed for severe pain. 02/18/22   Small, Brooke L, PA      Allergies    Aspirin    Review of Systems   Review of Systems  Genitourinary:  Negative for dysuria.  Musculoskeletal:  Positive for back pain.  All other systems reviewed and are negative.   Physical Exam Updated Vital Signs BP 120/85   Pulse 65   Temp 97.8 F (36.6 C) (Oral)   Resp 18   Ht 6' (1.829 m)   Wt 74 kg   SpO2 100%   BMI 22.13 kg/m  Physical Exam Vitals and  nursing note reviewed.  Constitutional:      General: He is not in acute distress.    Appearance: He is well-developed.  HENT:     Head: Normocephalic and atraumatic.  Eyes:     Conjunctiva/sclera: Conjunctivae normal.  Cardiovascular:     Rate and Rhythm: Normal rate and regular rhythm.     Heart sounds: No murmur heard. Pulmonary:     Effort: Pulmonary effort is normal. No respiratory distress.     Breath sounds: Normal breath sounds.  Abdominal:     Palpations: Abdomen is soft.     Tenderness: There is no abdominal tenderness.  Musculoskeletal:        General: No swelling.     Cervical back: Neck supple.     Lumbar back: Tenderness present.     Comments: Nonfocal tenderness throughout entire paralumbar spinal area  Skin:    General: Skin is warm and dry.     Capillary Refill: Capillary refill takes less than 2 seconds.  Neurological:     General: No focal deficit present.     Mental Status: He is alert.     GCS: GCS eye subscore is 4. GCS verbal subscore is 5. GCS motor subscore is 6.  Cranial Nerves: Cranial nerves 2-12 are intact. No cranial nerve deficit.     Sensory: Sensation is intact. No sensory deficit.     Motor: Motor function is intact. No weakness.     Coordination: Coordination is intact. Romberg sign negative.     Comments: 5 out of 5 strength bilateral lower extremities  Psychiatric:        Mood and Affect: Mood normal.     ED Results / Procedures / Treatments   Labs (all labs ordered are listed, but only abnormal results are displayed) Labs Reviewed - No data to display  EKG None  Radiology CT Lumbar Spine Wo Contrast  Result Date: 09/08/2022 CLINICAL DATA:  Low back pain, trauma EXAM: CT LUMBAR SPINE WITHOUT CONTRAST TECHNIQUE: Multidetector CT imaging of the lumbar spine was performed without intravenous contrast administration. Multiplanar CT image reconstructions were also generated. RADIATION DOSE REDUCTION: This exam was performed according  to the departmental dose-optimization program which includes automated exposure control, adjustment of the mA and/or kV according to patient size and/or use of iterative reconstruction technique. COMPARISON:  None Available. FINDINGS: Segmentation: 5 lumbar type vertebral bodies. Alignment: Normal lumbar lordosis. Vertebrae: No acute fracture or focal pathologic process. Paraspinal and other soft tissues: Negative. Disc levels: Intervertebral disc spaces are maintained. Spinal canal is patent. IMPRESSION: Normal lumbar spine CT. Electronically Signed   By: Charline Bills M.D.   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 Irregular linear lucency extends along the left L3 transverse process on the frontal projection but is not confirmed on the left posterior oblique view. This is most likely due to bowel gas simulating a fracture, but correlation with any point tenderness in the left paracentral location at the L3 level is recommended. No malalignment observed. No vertebral compression fracture is identified. IMPRESSION: 1. Irregular linear lucency along the left L3 transverse process on the frontal projection but not confirmed on the left posterior oblique view. This is most likely due to bowel gas simulating a fracture, but correlation with any point tenderness in the left paracentral location at the L3 level is recommended. Electronically Signed   By: Gaylyn Rong M.D.   On: 09/08/2022 18:21    Procedures Procedures   Medications Ordered in ED Medications  lidocaine (LIDODERM) 5 % 1 patch (1 patch Transdermal Patch Applied 09/08/22 1747)  cyclobenzaprine (FLEXERIL) tablet 10 mg (10 mg Oral Given 09/08/22 1746)  ketorolac (TORADOL) 15 MG/ML injection 15 mg (15  mg Intramuscular Given 09/08/22 1747)    ED Course/ Medical Decision Making/ A&P Clinical Course as of 09/08/22 2347  Mon Sep 08, 2022  1828 DG Lumbar Spine Complete [CG]    Clinical Course User Index [CG] Al Decant, PA-C   Medical Decision Making Amount and/or Complexity of Data Reviewed Radiology: ordered. Decision-making details documented in ED Course.  Risk Prescription drug management.   32 year old male presents to ED for evaluation.  Please see HPI for further details.  On examination patient is afebrile and nontachycardic.  Patient does have diffuse tenderness throughout entire paralumbar spinal region to low back.  Patient has 5 out of 5 strength bilateral lower extremities.  No centralized lumbar spinal tenderness.  No overlying skin change.  Full range of motion of lumbar spine.  Initial plain  film imaging of patient's lumbar spine shows irregular linear lucency along the left L3 transverse process on the frontal projection that is not confirmed on the left posterior oblique view.  This could be most likely due to bowel gas simulating a fracture but correlation with point tenderness recommended.  Will proceed with CT scan of the patient's lumbar spine.  Lumbar spine shows no evidence of fracture.  Patient provided Toradol, Flexeril, Lidoderm patch.  Patient reports feeling better after this was administered.  Patient discharged in stable condition at this time.  Will follow-up with PCP.  Will return to ED with any new symptoms.   Final Clinical Impression(s) / ED Diagnoses Final diagnoses:  Fall, initial encounter  Acute bilateral low back pain without sciatica    Rx / DC Orders ED Discharge Orders          Ordered    cyclobenzaprine (FLEXERIL) 10 MG tablet  2 times daily PRN        09/08/22 1935              Clent Ridges 09/08/22 2347    Rolan Bucco, MD 09/09/22 1432

## 2022-09-08 NOTE — ED Notes (Signed)
Patient transported to CT 

## 2022-09-08 NOTE — ED Triage Notes (Signed)
Pt to ED c/o back pain since Saturday, known injury, fell down steps. Reports no relief with OTC pain medications. Ambulatory in triage. No s/s of acute distress noted in triage

## 2022-09-08 NOTE — ED Triage Notes (Signed)
Pt reports falling down some steps Saturday. Endorses lower back pain. Ambulatory to room. Pt has taken 2 800 ibuprofen for pain.

## 2022-09-08 NOTE — ED Notes (Signed)
Patient transported to X-ray 

## 2022-09-08 NOTE — Discharge Instructions (Signed)
Return to the ED with any new or worsening symptoms Follow-up with your PCP for further evaluation Please continue taking ibuprofen at home, 600 mg.  May also take Tylenol 650 mg. Please take muscle relaxer that I prescribed you.  Please be aware that this will cause you to become drowsy so do not drive or operate heavy machinery while taking this medication
